# Patient Record
Sex: Female | Born: 1935 | Race: White | Hispanic: No | Marital: Married
Health system: Southern US, Community
[De-identification: ages and names within clinical notes are randomized; demographics above are authoritative.]

---

## 2006-10-19 ENCOUNTER — Inpatient Hospital Stay: Payer: Self-pay | Admitting: Internal Medicine

## 2006-10-25 ENCOUNTER — Encounter: Payer: Self-pay | Admitting: Internal Medicine

## 2008-06-25 ENCOUNTER — Inpatient Hospital Stay: Payer: Self-pay | Admitting: Internal Medicine

## 2008-09-14 ENCOUNTER — Emergency Department: Payer: Self-pay | Admitting: Emergency Medicine

## 2009-04-04 IMAGING — US US CAROTID DUPLEX BILAT
1 series · 17 of 24 positions shown · non-contrast
Comparison: none

REASON FOR EXAM: syncope
COMMENTS:

[Series 1: us carotid duplex bilat · 17 of 54 slices shown]
[im 1/54]
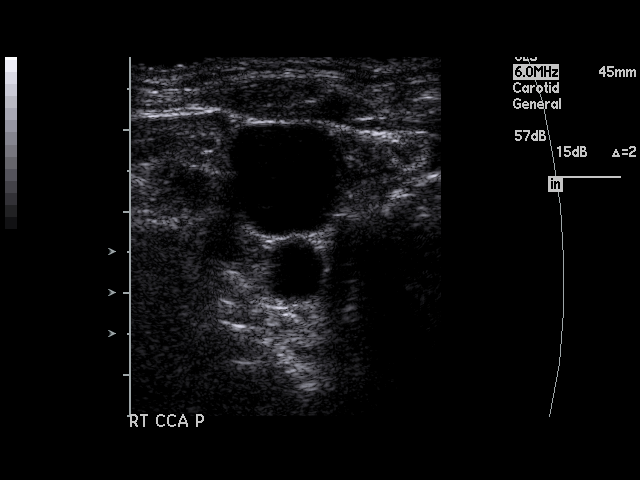
[im 5/54]
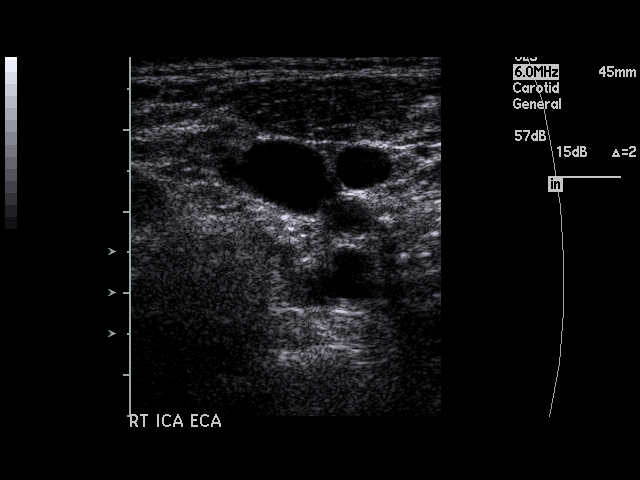
[im 7/54]
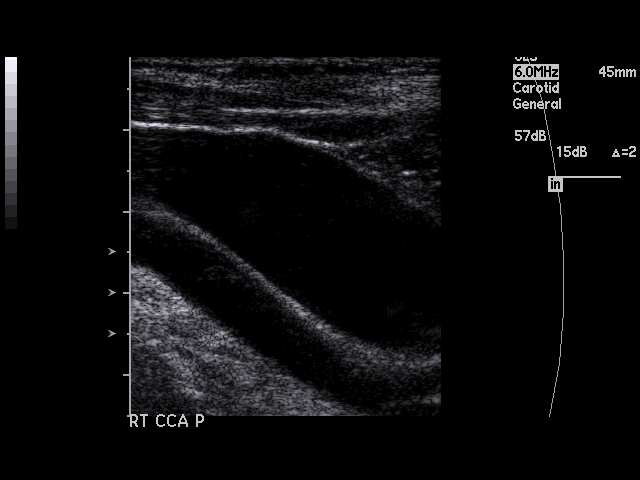
[im 10/54]
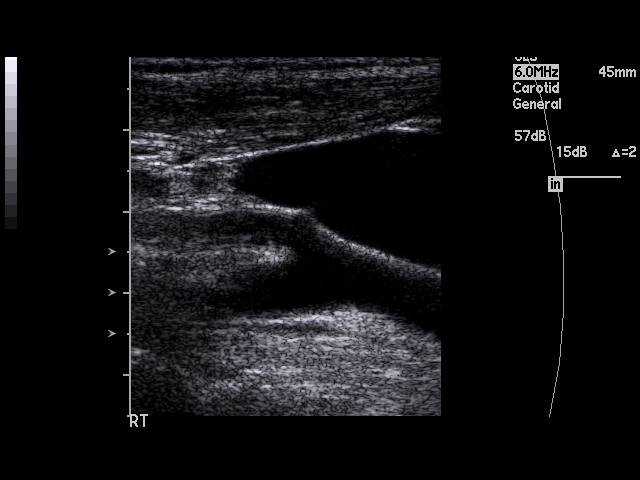
[im 14/54]
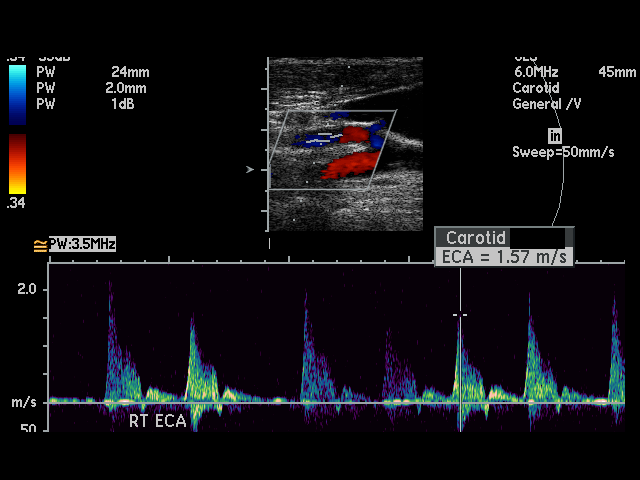
[im 17/54]
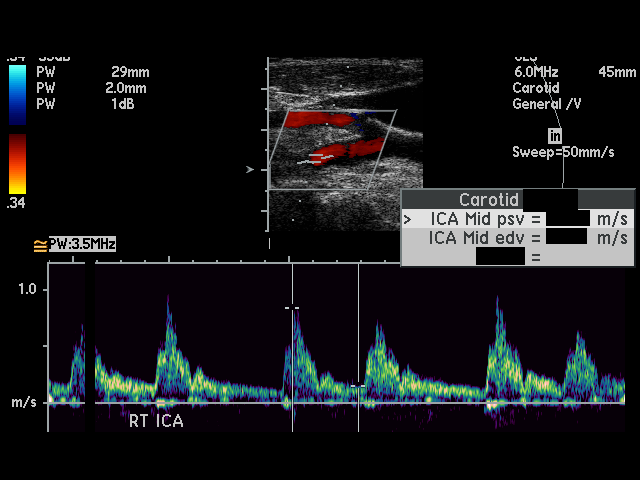
[im 21/54]
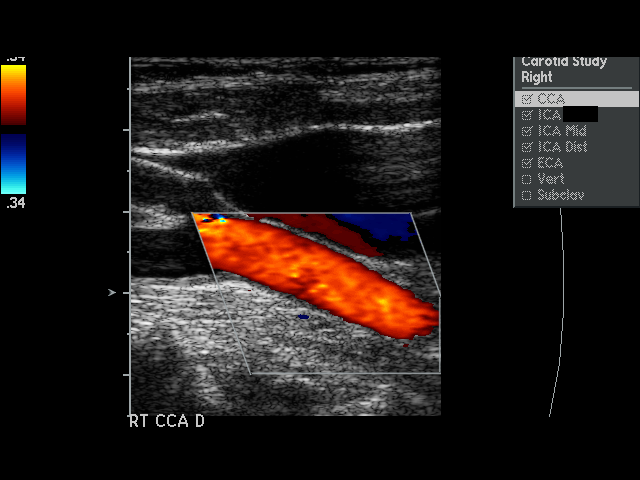
[im 24/54]
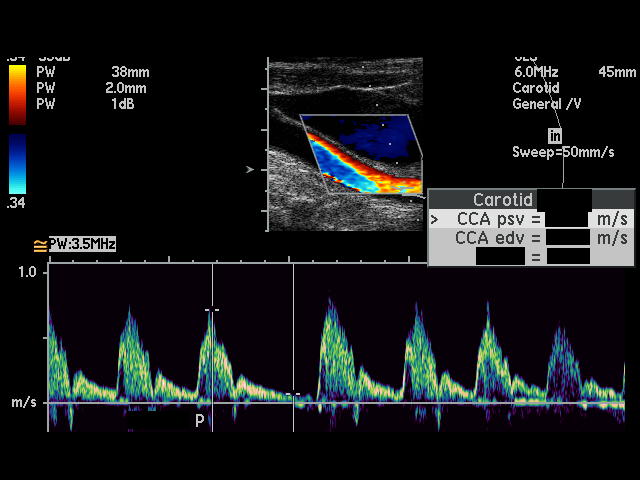
[im 28/54]
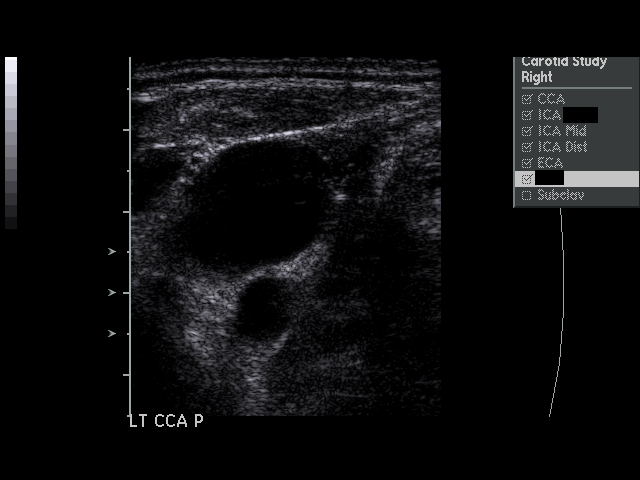
[im 30/54]
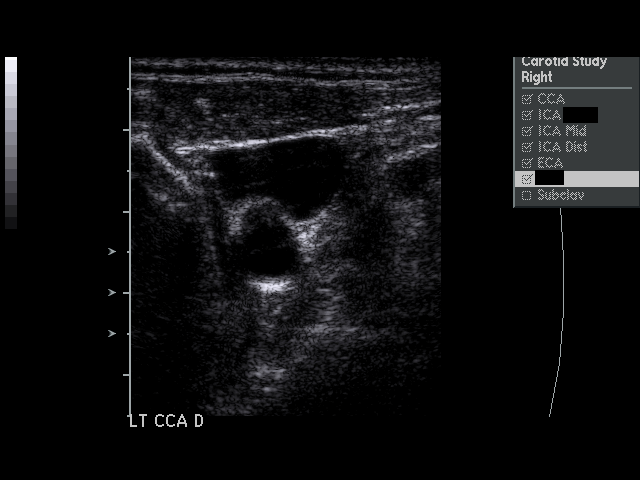
[im 33/54]
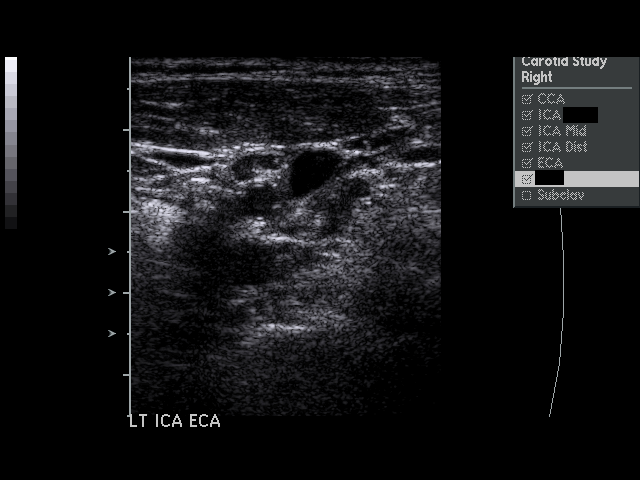
[im 37/54]
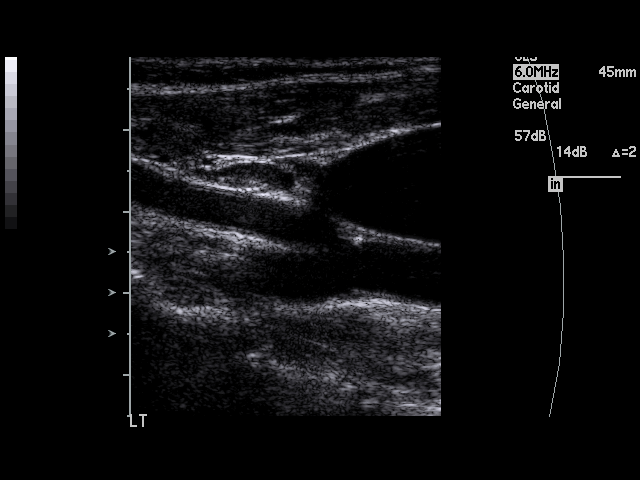
[im 40/54]
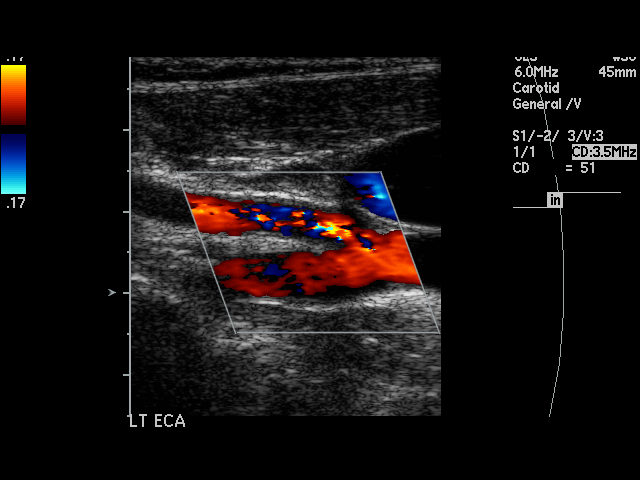
[im 44/54]
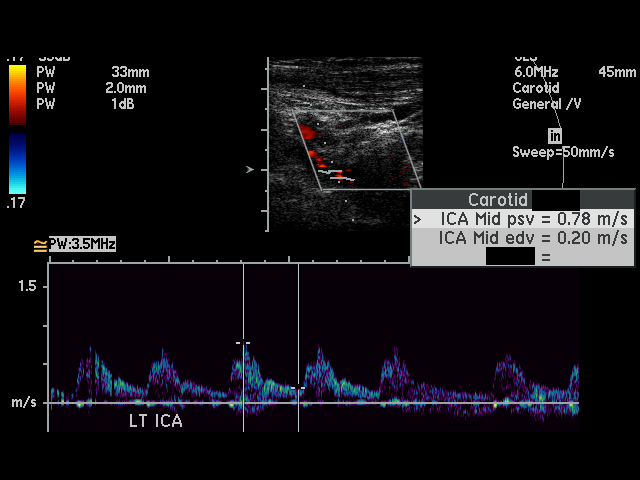
[im 47/54]
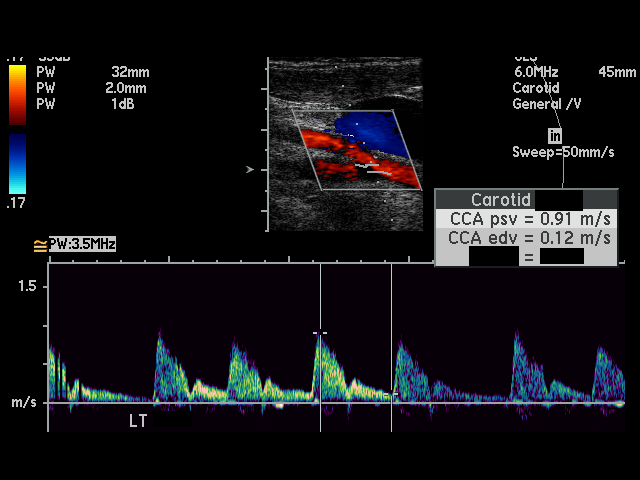
[im 49/54]
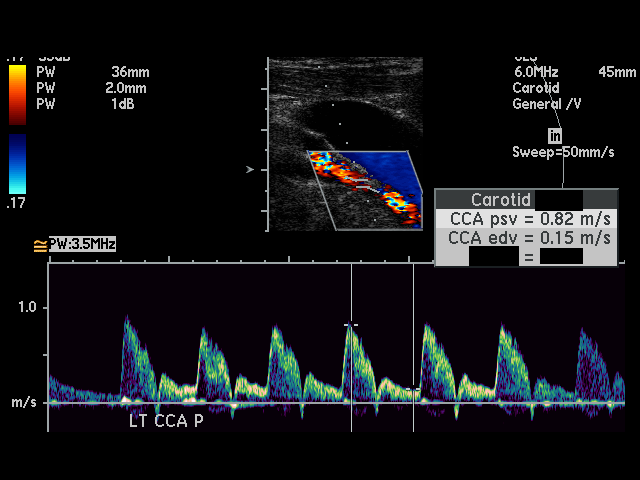
[im 54/54]
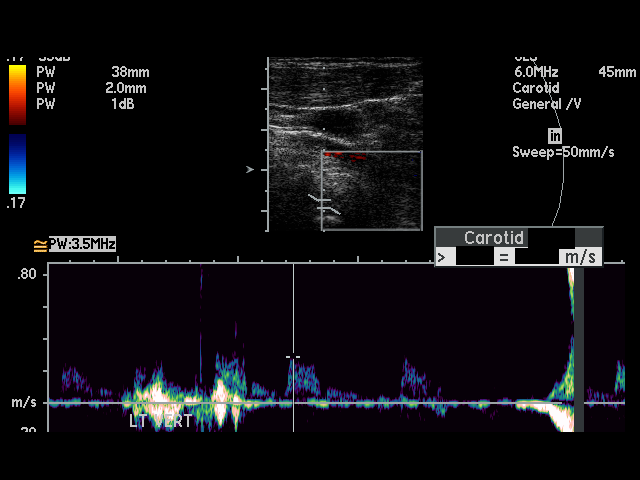

[17 of 24 positions shown; findings below may reference images not displayed]

PROCEDURE:     US  - US CAROTID DOPPLER BILATERAL  - June 26, 2008  [DATE]

RESULT:     Doppler interrogation of the carotids demonstrates no
significant plaque formation. Antegrade flow was seen in both vertebral
arteries. The color and spectral Doppler waveforms appear normal. The peak
systolic velocities are normal. The internal to common carotid peak systolic
velocity ratio is 1.17 on the RIGHT and 1.07 on the LEFT.
IMPRESSION: No evidence of significant stenosis. No significant atherosclerotic change
demonstrated.

## 2009-08-19 ENCOUNTER — Ambulatory Visit: Payer: Self-pay | Admitting: Internal Medicine

## 2009-09-17 ENCOUNTER — Ambulatory Visit: Payer: Self-pay | Admitting: Internal Medicine

## 2009-10-22 ENCOUNTER — Ambulatory Visit: Payer: Self-pay | Admitting: Internal Medicine

## 2010-05-10 ENCOUNTER — Encounter: Payer: Self-pay | Admitting: Internal Medicine

## 2010-05-18 ENCOUNTER — Ambulatory Visit: Payer: Self-pay | Admitting: Internal Medicine

## 2010-05-24 ENCOUNTER — Encounter: Payer: Self-pay | Admitting: Internal Medicine

## 2010-05-31 ENCOUNTER — Ambulatory Visit: Payer: Self-pay | Admitting: Family Medicine

## 2010-06-03 ENCOUNTER — Encounter: Payer: Self-pay | Admitting: Internal Medicine

## 2010-07-26 ENCOUNTER — Encounter: Payer: Self-pay | Admitting: Internal Medicine

## 2010-09-06 ENCOUNTER — Encounter: Payer: Self-pay | Admitting: Internal Medicine

## 2010-09-17 DIAGNOSIS — F0280 Dementia in other diseases classified elsewhere without behavioral disturbance: Secondary | ICD-10-CM

## 2010-09-17 DIAGNOSIS — G309 Alzheimer's disease, unspecified: Secondary | ICD-10-CM

## 2010-09-21 NOTE — Letter (Signed)
Summary: [facility]  [facility]   Imported By: Beau Fanny 05/19/2010 10:45:22  _____________________________________________________________________  External Attachment:    Type:   Image     Comment:   External Document

## 2010-09-21 NOTE — Miscellaneous (Signed)
Summary: SN Orders/[facility]  SN Orders/[facility]   Imported By: Lanelle Bal 05/31/2010 09:32:52  _____________________________________________________________________  External Attachment:    Type:   Image     Comment:   External Document

## 2010-09-21 NOTE — Miscellaneous (Signed)
[facility]: [facility]  [facility]   Imported By: Beau Fanny 06/03/2010 14:38:51  _____________________________________________________________________  External Attachment:    Type:   Image     Comment:   External Document

## 2010-09-21 NOTE — Miscellaneous (Signed)
Summary: Plan no orders/[facility]  Plan no orders/[facility]   Imported By: Lester Shell 07/29/2010 11:10:01  _____________________________________________________________________  External Attachment:    Type:   Image     Comment:   External Document

## 2010-09-23 NOTE — Miscellaneous (Signed)
Summary: [facility] Care/[facility]  Plan of Care/[facility]   Imported By: Maryln Gottron 09/13/2010 10:46:23  _____________________________________________________________________  External Attachment:    Type:   Image     Comment:   External Document

## 2010-10-13 DIAGNOSIS — I10 Essential (primary) hypertension: Secondary | ICD-10-CM

## 2010-10-13 DIAGNOSIS — G309 Alzheimer's disease, unspecified: Secondary | ICD-10-CM

## 2010-10-13 DIAGNOSIS — M8440XA Pathological fracture, unspecified site, initial encounter for fracture: Secondary | ICD-10-CM

## 2010-10-13 DIAGNOSIS — F0280 Dementia in other diseases classified elsewhere without behavioral disturbance: Secondary | ICD-10-CM

## 2010-10-21 ENCOUNTER — Encounter: Payer: Self-pay | Admitting: Internal Medicine

## 2010-11-01 DIAGNOSIS — F0280 Dementia in other diseases classified elsewhere without behavioral disturbance: Secondary | ICD-10-CM

## 2010-11-01 DIAGNOSIS — G309 Alzheimer's disease, unspecified: Secondary | ICD-10-CM

## 2010-11-02 ENCOUNTER — Encounter: Payer: Self-pay | Admitting: Internal Medicine

## 2010-11-02 DIAGNOSIS — F329 Major depressive disorder, single episode, unspecified: Secondary | ICD-10-CM

## 2010-11-02 DIAGNOSIS — F0390 Unspecified dementia without behavioral disturbance: Secondary | ICD-10-CM

## 2010-11-02 DIAGNOSIS — I10 Essential (primary) hypertension: Secondary | ICD-10-CM

## 2010-11-02 NOTE — Miscellaneous (Signed)
Summary: Orders/[facility]  Orders/[facility]   Imported By: Lanelle Bal 10/25/2010 13:06:13  _____________________________________________________________________  External Attachment:    Type:   Image     Comment:   External Document

## 2010-11-18 NOTE — Miscellaneous (Signed)
Summary: Hospice  Hospice   Imported By: Kassie Mends 11/08/2010 10:55:49  _____________________________________________________________________  External Attachment:    Type:   Image     Comment:   External Document

## 2010-12-07 DIAGNOSIS — I5032 Chronic diastolic (congestive) heart failure: Secondary | ICD-10-CM

## 2010-12-07 DIAGNOSIS — G309 Alzheimer's disease, unspecified: Secondary | ICD-10-CM

## 2010-12-07 DIAGNOSIS — F411 Generalized anxiety disorder: Secondary | ICD-10-CM

## 2010-12-07 DIAGNOSIS — M130 Polyarthritis, unspecified: Secondary | ICD-10-CM

## 2010-12-07 DIAGNOSIS — F0280 Dementia in other diseases classified elsewhere without behavioral disturbance: Secondary | ICD-10-CM

## 2011-02-10 DIAGNOSIS — F0280 Dementia in other diseases classified elsewhere without behavioral disturbance: Secondary | ICD-10-CM

## 2011-02-10 DIAGNOSIS — M130 Polyarthritis, unspecified: Secondary | ICD-10-CM

## 2011-02-10 DIAGNOSIS — IMO0002 Reserved for concepts with insufficient information to code with codable children: Secondary | ICD-10-CM

## 2011-02-10 DIAGNOSIS — G309 Alzheimer's disease, unspecified: Secondary | ICD-10-CM

## 2011-02-10 DIAGNOSIS — I5032 Chronic diastolic (congestive) heart failure: Secondary | ICD-10-CM

## 2011-03-31 DIAGNOSIS — I5032 Chronic diastolic (congestive) heart failure: Secondary | ICD-10-CM

## 2011-03-31 DIAGNOSIS — F0280 Dementia in other diseases classified elsewhere without behavioral disturbance: Secondary | ICD-10-CM

## 2011-03-31 DIAGNOSIS — IMO0002 Reserved for concepts with insufficient information to code with codable children: Secondary | ICD-10-CM

## 2011-03-31 DIAGNOSIS — G309 Alzheimer's disease, unspecified: Secondary | ICD-10-CM

## 2011-03-31 DIAGNOSIS — M4800 Spinal stenosis, site unspecified: Secondary | ICD-10-CM

## 2011-04-21 DIAGNOSIS — M21619 Bunion of unspecified foot: Secondary | ICD-10-CM

## 2011-06-09 DIAGNOSIS — F411 Generalized anxiety disorder: Secondary | ICD-10-CM

## 2011-06-09 DIAGNOSIS — G309 Alzheimer's disease, unspecified: Secondary | ICD-10-CM

## 2011-06-09 DIAGNOSIS — IMO0002 Reserved for concepts with insufficient information to code with codable children: Secondary | ICD-10-CM

## 2011-06-09 DIAGNOSIS — F0280 Dementia in other diseases classified elsewhere without behavioral disturbance: Secondary | ICD-10-CM

## 2011-06-09 DIAGNOSIS — I5032 Chronic diastolic (congestive) heart failure: Secondary | ICD-10-CM

## 2011-08-11 DIAGNOSIS — F0280 Dementia in other diseases classified elsewhere without behavioral disturbance: Secondary | ICD-10-CM

## 2011-08-11 DIAGNOSIS — G309 Alzheimer's disease, unspecified: Secondary | ICD-10-CM

## 2011-08-11 DIAGNOSIS — I5032 Chronic diastolic (congestive) heart failure: Secondary | ICD-10-CM

## 2011-08-11 DIAGNOSIS — F1020 Alcohol dependence, uncomplicated: Secondary | ICD-10-CM

## 2011-08-11 DIAGNOSIS — F411 Generalized anxiety disorder: Secondary | ICD-10-CM

## 2011-09-30 DIAGNOSIS — J069 Acute upper respiratory infection, unspecified: Secondary | ICD-10-CM

## 2011-10-06 DIAGNOSIS — F411 Generalized anxiety disorder: Secondary | ICD-10-CM

## 2011-10-06 DIAGNOSIS — F0280 Dementia in other diseases classified elsewhere without behavioral disturbance: Secondary | ICD-10-CM

## 2011-10-06 DIAGNOSIS — J209 Acute bronchitis, unspecified: Secondary | ICD-10-CM

## 2011-10-06 DIAGNOSIS — G309 Alzheimer's disease, unspecified: Secondary | ICD-10-CM

## 2011-10-06 DIAGNOSIS — IMO0002 Reserved for concepts with insufficient information to code with codable children: Secondary | ICD-10-CM

## 2011-10-11 DIAGNOSIS — J209 Acute bronchitis, unspecified: Secondary | ICD-10-CM

## 2011-11-29 DIAGNOSIS — I5032 Chronic diastolic (congestive) heart failure: Secondary | ICD-10-CM

## 2011-11-29 DIAGNOSIS — IMO0002 Reserved for concepts with insufficient information to code with codable children: Secondary | ICD-10-CM

## 2011-11-29 DIAGNOSIS — M48062 Spinal stenosis, lumbar region with neurogenic claudication: Secondary | ICD-10-CM

## 2011-11-29 DIAGNOSIS — G309 Alzheimer's disease, unspecified: Secondary | ICD-10-CM

## 2011-11-29 DIAGNOSIS — F0280 Dementia in other diseases classified elsewhere without behavioral disturbance: Secondary | ICD-10-CM

## 2012-02-10 DIAGNOSIS — E039 Hypothyroidism, unspecified: Secondary | ICD-10-CM

## 2012-02-10 DIAGNOSIS — F0280 Dementia in other diseases classified elsewhere without behavioral disturbance: Secondary | ICD-10-CM

## 2012-02-10 DIAGNOSIS — I10 Essential (primary) hypertension: Secondary | ICD-10-CM

## 2012-02-10 DIAGNOSIS — G309 Alzheimer's disease, unspecified: Secondary | ICD-10-CM

## 2012-04-12 DIAGNOSIS — F411 Generalized anxiety disorder: Secondary | ICD-10-CM

## 2012-04-12 DIAGNOSIS — I5032 Chronic diastolic (congestive) heart failure: Secondary | ICD-10-CM

## 2012-04-12 DIAGNOSIS — G309 Alzheimer's disease, unspecified: Secondary | ICD-10-CM

## 2012-04-12 DIAGNOSIS — IMO0002 Reserved for concepts with insufficient information to code with codable children: Secondary | ICD-10-CM

## 2012-04-12 DIAGNOSIS — F0280 Dementia in other diseases classified elsewhere without behavioral disturbance: Secondary | ICD-10-CM

## 2012-06-12 DIAGNOSIS — IMO0002 Reserved for concepts with insufficient information to code with codable children: Secondary | ICD-10-CM

## 2012-06-12 DIAGNOSIS — F0280 Dementia in other diseases classified elsewhere without behavioral disturbance: Secondary | ICD-10-CM

## 2012-06-12 DIAGNOSIS — G309 Alzheimer's disease, unspecified: Secondary | ICD-10-CM

## 2012-06-12 DIAGNOSIS — I5032 Chronic diastolic (congestive) heart failure: Secondary | ICD-10-CM

## 2012-06-12 DIAGNOSIS — F411 Generalized anxiety disorder: Secondary | ICD-10-CM

## 2012-07-27 DIAGNOSIS — I10 Essential (primary) hypertension: Secondary | ICD-10-CM

## 2012-07-27 DIAGNOSIS — G309 Alzheimer's disease, unspecified: Secondary | ICD-10-CM

## 2012-07-27 DIAGNOSIS — F0280 Dementia in other diseases classified elsewhere without behavioral disturbance: Secondary | ICD-10-CM

## 2012-07-27 DIAGNOSIS — F329 Major depressive disorder, single episode, unspecified: Secondary | ICD-10-CM

## 2012-07-27 DIAGNOSIS — E039 Hypothyroidism, unspecified: Secondary | ICD-10-CM

## 2012-09-27 DIAGNOSIS — F0280 Dementia in other diseases classified elsewhere without behavioral disturbance: Secondary | ICD-10-CM

## 2012-09-27 DIAGNOSIS — I5032 Chronic diastolic (congestive) heart failure: Secondary | ICD-10-CM

## 2012-09-27 DIAGNOSIS — F411 Generalized anxiety disorder: Secondary | ICD-10-CM

## 2012-09-27 DIAGNOSIS — IMO0002 Reserved for concepts with insufficient information to code with codable children: Secondary | ICD-10-CM

## 2012-09-27 DIAGNOSIS — G309 Alzheimer's disease, unspecified: Secondary | ICD-10-CM

## 2012-12-03 DIAGNOSIS — F0280 Dementia in other diseases classified elsewhere without behavioral disturbance: Secondary | ICD-10-CM

## 2012-12-03 DIAGNOSIS — I5032 Chronic diastolic (congestive) heart failure: Secondary | ICD-10-CM

## 2012-12-03 DIAGNOSIS — IMO0002 Reserved for concepts with insufficient information to code with codable children: Secondary | ICD-10-CM

## 2012-12-03 DIAGNOSIS — G309 Alzheimer's disease, unspecified: Secondary | ICD-10-CM

## 2012-12-03 DIAGNOSIS — F411 Generalized anxiety disorder: Secondary | ICD-10-CM

## 2013-02-14 DIAGNOSIS — G309 Alzheimer's disease, unspecified: Secondary | ICD-10-CM

## 2013-02-14 DIAGNOSIS — F411 Generalized anxiety disorder: Secondary | ICD-10-CM

## 2013-02-14 DIAGNOSIS — I5032 Chronic diastolic (congestive) heart failure: Secondary | ICD-10-CM

## 2013-02-14 DIAGNOSIS — IMO0002 Reserved for concepts with insufficient information to code with codable children: Secondary | ICD-10-CM

## 2013-02-14 DIAGNOSIS — F0280 Dementia in other diseases classified elsewhere without behavioral disturbance: Secondary | ICD-10-CM

## 2013-04-13 DIAGNOSIS — F411 Generalized anxiety disorder: Secondary | ICD-10-CM

## 2013-04-13 DIAGNOSIS — F0280 Dementia in other diseases classified elsewhere without behavioral disturbance: Secondary | ICD-10-CM

## 2013-04-13 DIAGNOSIS — G309 Alzheimer's disease, unspecified: Secondary | ICD-10-CM

## 2013-04-13 DIAGNOSIS — IMO0002 Reserved for concepts with insufficient information to code with codable children: Secondary | ICD-10-CM

## 2013-04-13 DIAGNOSIS — I5032 Chronic diastolic (congestive) heart failure: Secondary | ICD-10-CM

## 2013-05-30 DIAGNOSIS — F0280 Dementia in other diseases classified elsewhere without behavioral disturbance: Secondary | ICD-10-CM

## 2013-05-30 DIAGNOSIS — I5032 Chronic diastolic (congestive) heart failure: Secondary | ICD-10-CM

## 2013-05-30 DIAGNOSIS — IMO0002 Reserved for concepts with insufficient information to code with codable children: Secondary | ICD-10-CM

## 2013-05-30 DIAGNOSIS — F411 Generalized anxiety disorder: Secondary | ICD-10-CM

## 2013-05-30 DIAGNOSIS — G309 Alzheimer's disease, unspecified: Secondary | ICD-10-CM

## 2013-08-02 DIAGNOSIS — F411 Generalized anxiety disorder: Secondary | ICD-10-CM

## 2013-08-02 DIAGNOSIS — IMO0002 Reserved for concepts with insufficient information to code with codable children: Secondary | ICD-10-CM

## 2013-08-02 DIAGNOSIS — M48061 Spinal stenosis, lumbar region without neurogenic claudication: Secondary | ICD-10-CM

## 2013-08-02 DIAGNOSIS — G309 Alzheimer's disease, unspecified: Secondary | ICD-10-CM

## 2013-08-02 DIAGNOSIS — I509 Heart failure, unspecified: Secondary | ICD-10-CM

## 2013-08-02 DIAGNOSIS — F0280 Dementia in other diseases classified elsewhere without behavioral disturbance: Secondary | ICD-10-CM

## 2013-08-20 DIAGNOSIS — L97409 Non-pressure chronic ulcer of unspecified heel and midfoot with unspecified severity: Secondary | ICD-10-CM

## 2013-08-26 DIAGNOSIS — J069 Acute upper respiratory infection, unspecified: Secondary | ICD-10-CM

## 2013-09-06 DIAGNOSIS — J151 Pneumonia due to Pseudomonas: Secondary | ICD-10-CM

## 2013-10-01 DIAGNOSIS — IMO0002 Reserved for concepts with insufficient information to code with codable children: Secondary | ICD-10-CM

## 2013-10-17 DIAGNOSIS — L89609 Pressure ulcer of unspecified heel, unspecified stage: Secondary | ICD-10-CM

## 2013-11-14 DIAGNOSIS — L89609 Pressure ulcer of unspecified heel, unspecified stage: Secondary | ICD-10-CM

## 2013-11-28 DIAGNOSIS — I5032 Chronic diastolic (congestive) heart failure: Secondary | ICD-10-CM

## 2013-11-28 DIAGNOSIS — L89609 Pressure ulcer of unspecified heel, unspecified stage: Secondary | ICD-10-CM

## 2013-11-28 DIAGNOSIS — IMO0002 Reserved for concepts with insufficient information to code with codable children: Secondary | ICD-10-CM

## 2013-11-28 DIAGNOSIS — F411 Generalized anxiety disorder: Secondary | ICD-10-CM

## 2013-11-28 DIAGNOSIS — F0280 Dementia in other diseases classified elsewhere without behavioral disturbance: Secondary | ICD-10-CM

## 2013-11-28 DIAGNOSIS — G309 Alzheimer's disease, unspecified: Secondary | ICD-10-CM

## 2013-11-28 DIAGNOSIS — M48061 Spinal stenosis, lumbar region without neurogenic claudication: Secondary | ICD-10-CM

## 2014-01-03 DIAGNOSIS — J069 Acute upper respiratory infection, unspecified: Secondary | ICD-10-CM

## 2014-01-15 DIAGNOSIS — R059 Cough, unspecified: Secondary | ICD-10-CM

## 2014-01-15 DIAGNOSIS — R05 Cough: Secondary | ICD-10-CM

## 2014-01-27 DIAGNOSIS — F411 Generalized anxiety disorder: Secondary | ICD-10-CM

## 2014-01-27 DIAGNOSIS — I5032 Chronic diastolic (congestive) heart failure: Secondary | ICD-10-CM

## 2014-01-27 DIAGNOSIS — M48061 Spinal stenosis, lumbar region without neurogenic claudication: Secondary | ICD-10-CM

## 2014-01-27 DIAGNOSIS — F0280 Dementia in other diseases classified elsewhere without behavioral disturbance: Secondary | ICD-10-CM

## 2014-01-27 DIAGNOSIS — G309 Alzheimer's disease, unspecified: Secondary | ICD-10-CM

## 2014-01-27 DIAGNOSIS — L8992 Pressure ulcer of unspecified site, stage 2: Secondary | ICD-10-CM

## 2014-01-27 DIAGNOSIS — IMO0002 Reserved for concepts with insufficient information to code with codable children: Secondary | ICD-10-CM

## 2014-02-13 DIAGNOSIS — R319 Hematuria, unspecified: Secondary | ICD-10-CM

## 2014-02-17 ENCOUNTER — Telehealth: Payer: Self-pay | Admitting: Family Medicine

## 2014-02-17 NOTE — Telephone Encounter (Signed)
Noted This was expected and daughter aware of the situation

## 2014-02-17 NOTE — Telephone Encounter (Signed)
Confidential Office Message [address] [place], [place] [address] p. [phone] f. [phone] To: [facility] (After Hours T[facility]) Fax: [phone] From: [facility] Date/ Time: 01-07-14 7:27 AM Taken By: Adline PotterMary Catherine Middleton, CSR Caller: Tammy [facility]: [facility] Patient: Veronica Actonendergrass, Farrie DOB: 12/04/1919 Phone: [phone] Reason for Call: Babette Relicammy is calling from [facility] regarding the death of Veronica ActonJacqueline Beason. Patient of [place]man [place] , [facility](Family Practice). The patient expired on 01-07-14 at 07:15. Regarding Appointment: Appt Date: Appt Time: Unknown Provider: Reason: Details: Confidential Outcome:

## 2014-02-19 DEATH — deceased
